# Patient Record
Sex: Male | Born: 1980 | Race: White | Hispanic: No | Marital: Single | Smoking: Current every day smoker
Health system: Southern US, Community
[De-identification: ages and names within clinical notes are randomized; demographics above are authoritative.]

---

## 2004-03-21 ENCOUNTER — Emergency Department (HOSPITAL_COMMUNITY): Admission: EM | Admit: 2004-03-21 | Discharge: 2004-03-21 | Payer: Self-pay | Admitting: Emergency Medicine

## 2009-04-09 ENCOUNTER — Ambulatory Visit: Payer: Self-pay | Admitting: Internal Medicine

## 2009-04-09 ENCOUNTER — Encounter: Payer: Self-pay | Admitting: Internal Medicine

## 2010-11-07 ENCOUNTER — Encounter: Payer: Self-pay | Admitting: Internal Medicine

## 2013-06-13 ENCOUNTER — Ambulatory Visit (INDEPENDENT_AMBULATORY_CARE_PROVIDER_SITE_OTHER): Payer: BC Managed Care – PPO | Admitting: Internal Medicine

## 2013-06-13 ENCOUNTER — Ambulatory Visit: Payer: BC Managed Care – PPO

## 2013-06-13 VITALS — BP 122/74 | HR 67 | Temp 98.4°F | Resp 18 | Ht 74.0 in | Wt 283.0 lb

## 2013-06-13 DIAGNOSIS — M25441 Effusion, right hand: Secondary | ICD-10-CM

## 2013-06-13 DIAGNOSIS — S60221A Contusion of right hand, initial encounter: Secondary | ICD-10-CM

## 2013-06-13 DIAGNOSIS — M25449 Effusion, unspecified hand: Secondary | ICD-10-CM

## 2013-06-13 DIAGNOSIS — M79609 Pain in unspecified limb: Secondary | ICD-10-CM

## 2013-06-13 DIAGNOSIS — M79641 Pain in right hand: Secondary | ICD-10-CM

## 2013-06-13 DIAGNOSIS — S63501A Unspecified sprain of right wrist, initial encounter: Secondary | ICD-10-CM
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
## 2015-01-15 IMAGING — CR DG HAND COMPLETE 3+V*R*
3 series · 3 of 3 positions shown · non-contrast
Comparison: None

CLINICAL DATA: Right hand pain and swelling post injury

EXAM:
RIGHT HAND - COMPLETE 3+ VIEW

[PA]
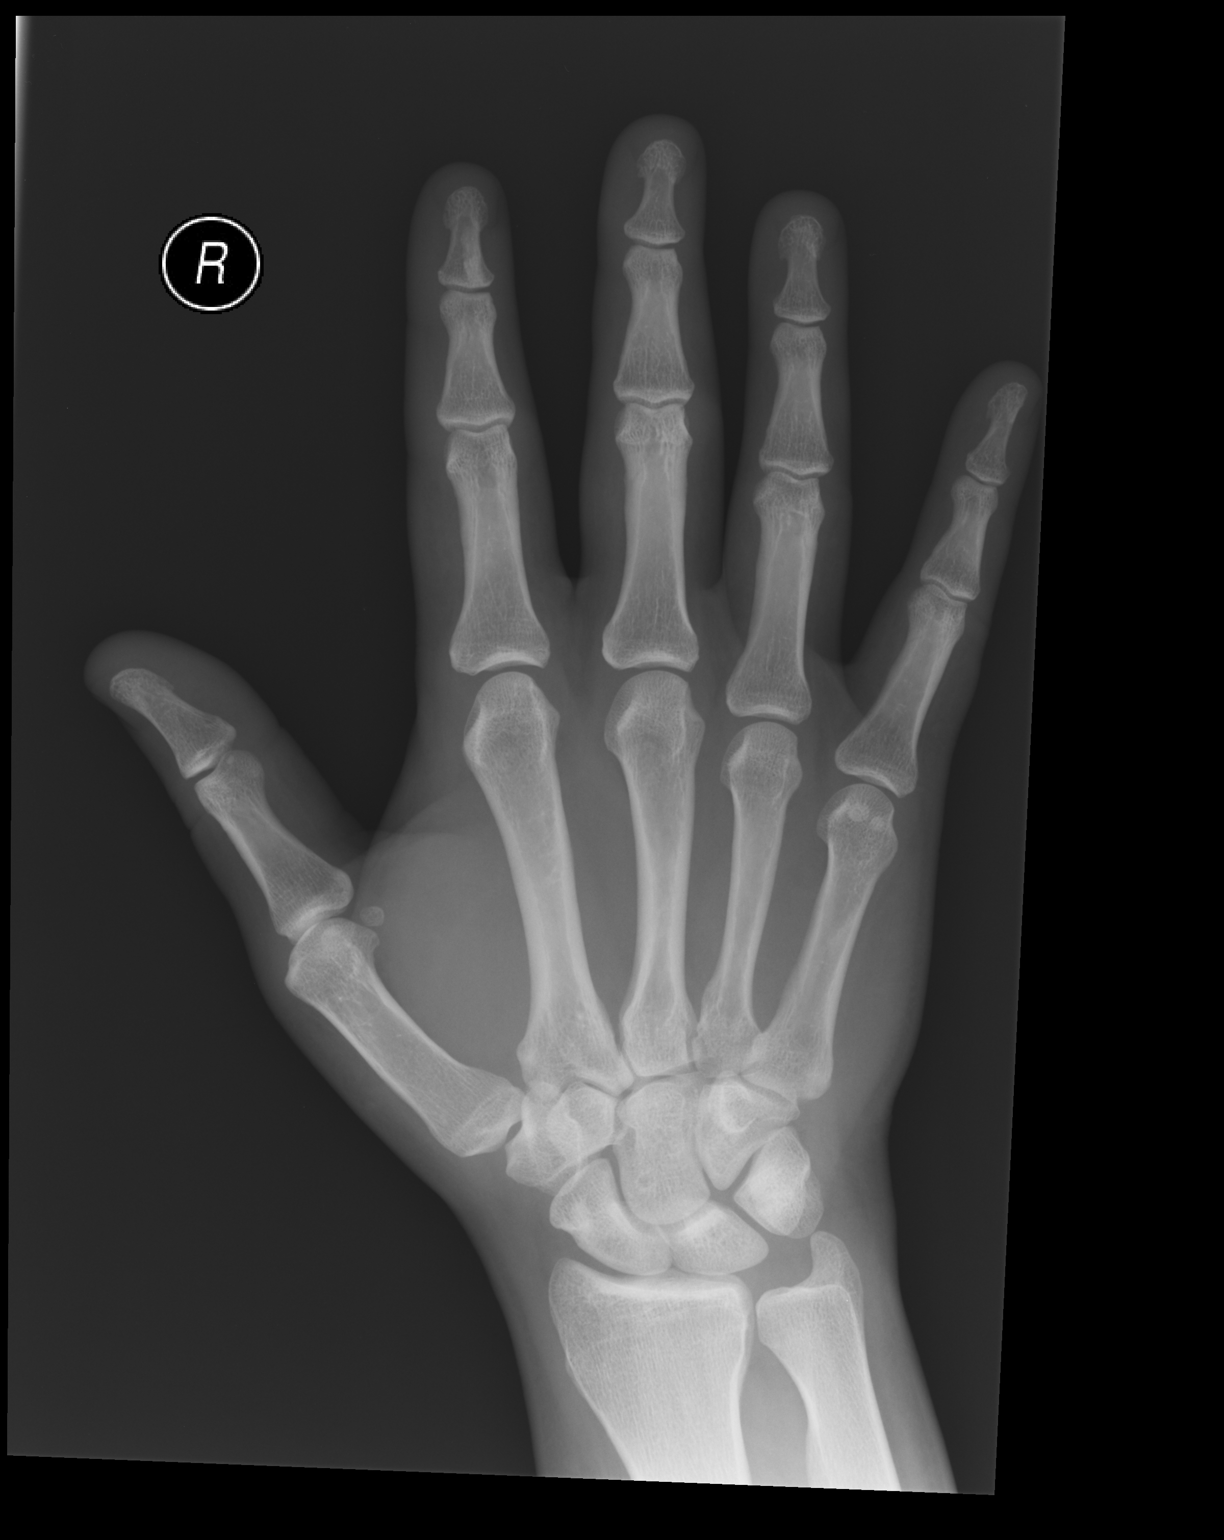

[lateral]
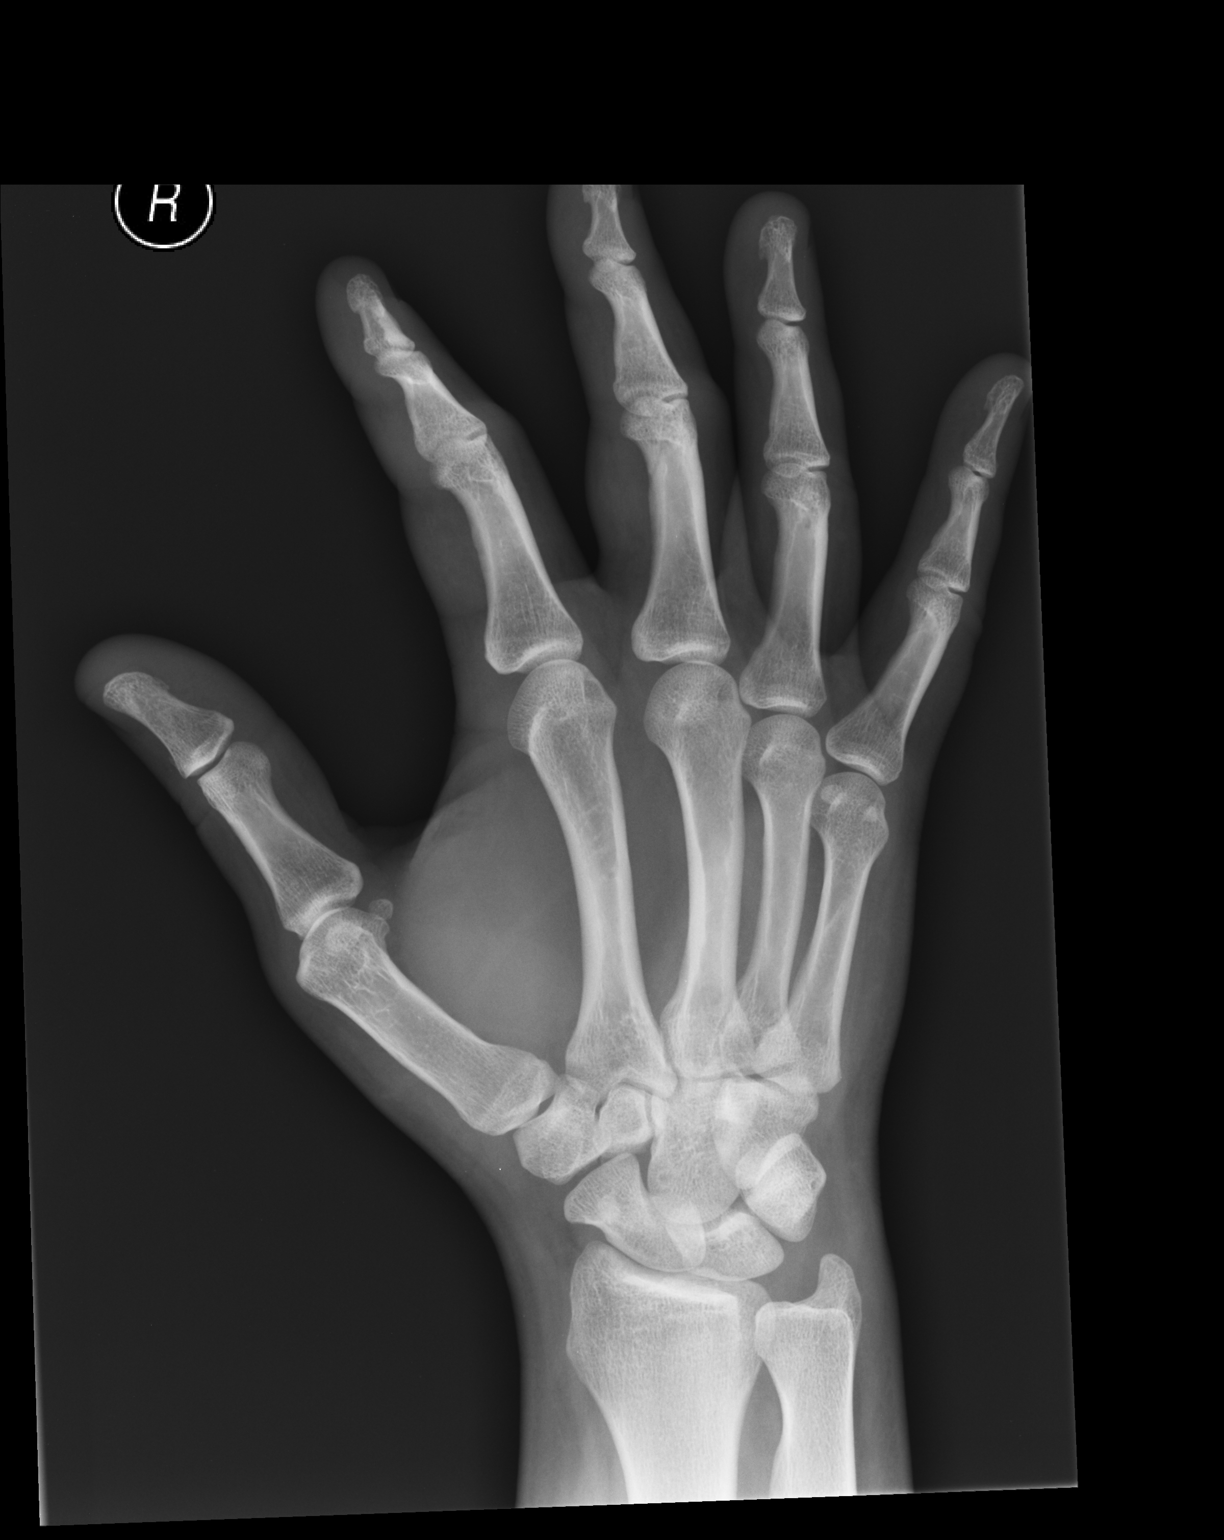

[pa obl]
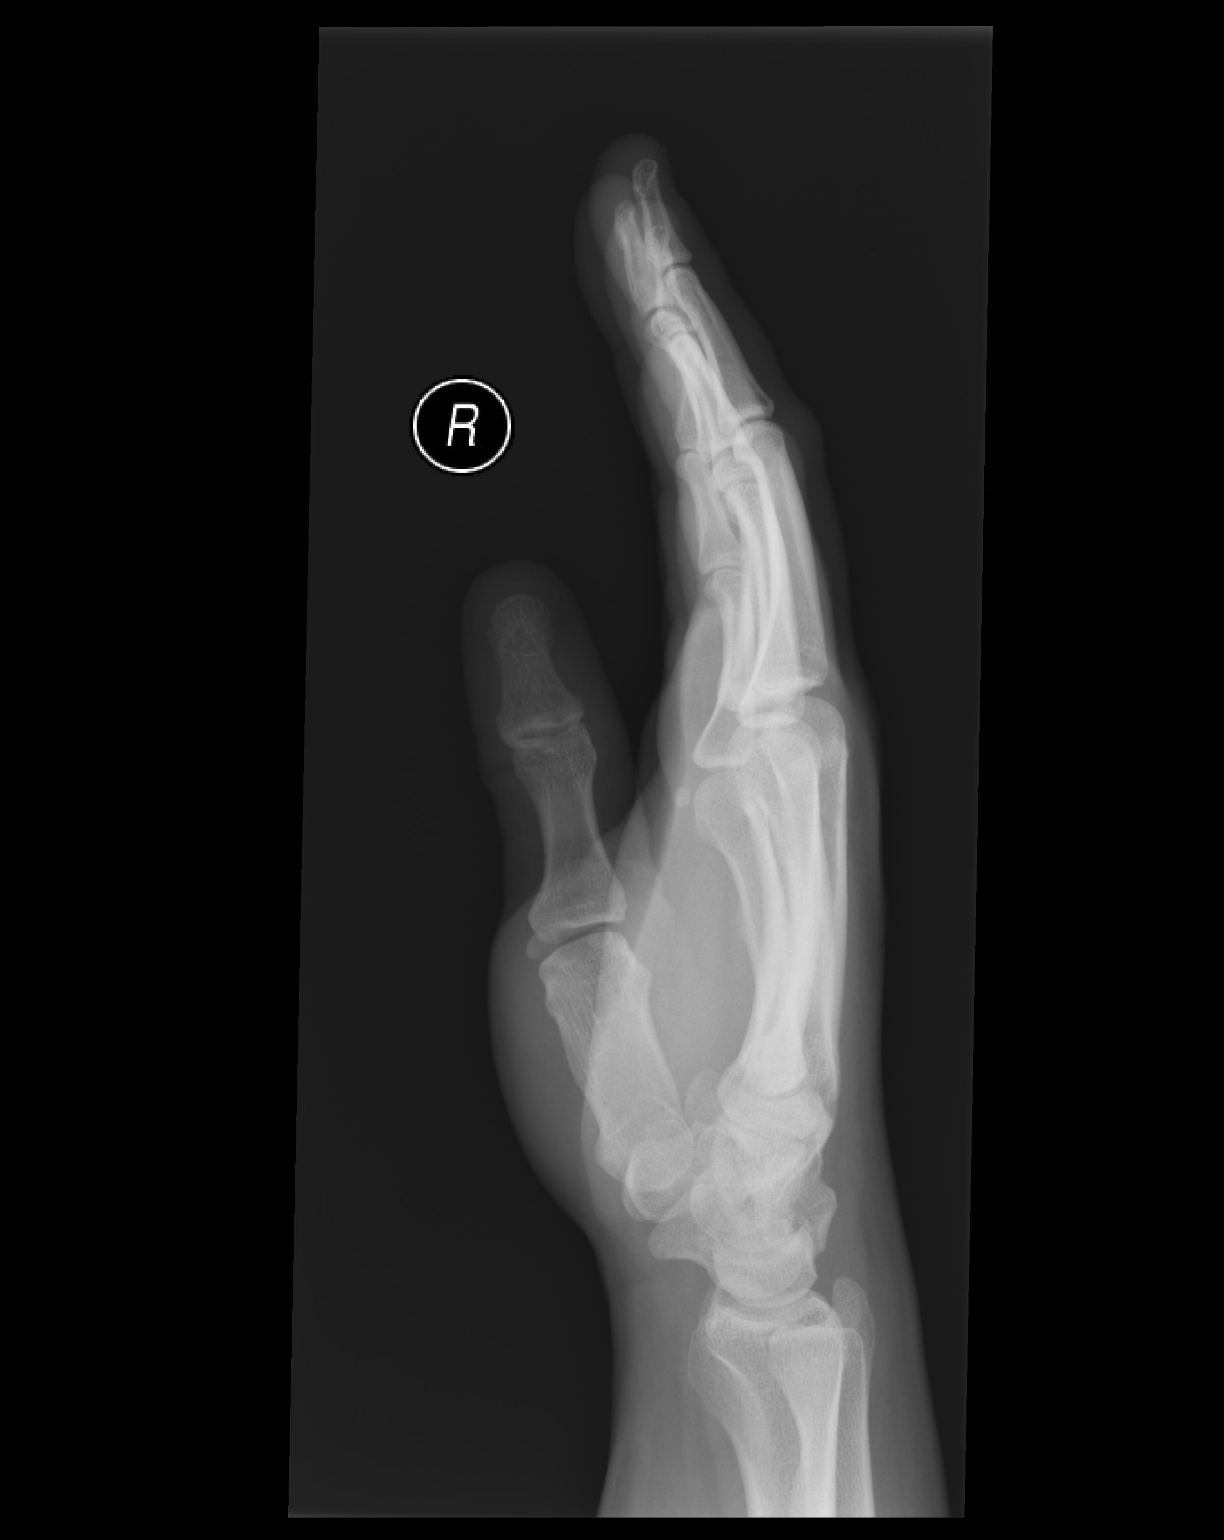

[3 of 3 positions shown; findings below may reference images not displayed]

FINDINGS: Joint spaces preserved.
Bone island the distal pole scaphoid.
No acute fracture, dislocation or bone destruction.
Fingers superimposed on lateral view limiting assessment.
IMPRESSION: No acute osseous abnormalities.

## 2015-06-07 ENCOUNTER — Ambulatory Visit (INDEPENDENT_AMBULATORY_CARE_PROVIDER_SITE_OTHER): Payer: 59 | Admitting: Family Medicine

## 2015-06-07 VITALS — BP 130/80 | HR 64 | Temp 98.2°F | Resp 16 | Ht 74.5 in | Wt 304.0 lb

## 2015-06-07 DIAGNOSIS — S29019A Strain of muscle and tendon of unspecified wall of thorax, initial encounter: Secondary | ICD-10-CM

## 2015-06-07 DIAGNOSIS — M62830 Muscle spasm of back: Secondary | ICD-10-CM | POA: Diagnosis not present

## 2015-06-07 DIAGNOSIS — S29012A Strain of muscle and tendon of back wall of thorax, initial encounter: Secondary | ICD-10-CM

## 2015-06-07 MED ORDER — TRAMADOL HCL 50 MG PO TABS: 50.0000 mg | ORAL_TABLET | Freq: Three times a day (TID) | ORAL | Status: AC | PRN

## 2015-06-07 MED ORDER — DICLOFENAC SODIUM 75 MG PO TBEC: 75.0000 mg | DELAYED_RELEASE_TABLET | Freq: Three times a day (TID) | ORAL | Status: AC

## 2015-06-07 MED ORDER — CYCLOBENZAPRINE HCL 10 MG PO TABS: 10.0000 mg | ORAL_TABLET | Freq: Three times a day (TID) | ORAL | Status: AC | PRN

## 2015-06-07 NOTE — Progress Notes (Signed)
Subjective:    Patient ID: Matthew Anthony, male    DOB: [birth year], 34 y.o.   MRN: [id number] This chart was scribed for Norberto Sorenson, MD by Jolene Provost, Medical Scribe. This patient was seen in Room 10 and the patient's care was started a 2:19 PM.  Chief Complaint  Patient presents with  . Spasms    ribs    HPI HPI Comments: Matthew Anthony is a 34 y.o. male who presents to [facility] complaining of mid back pain that radiates into the low back for the last two weeks. The pain was almost completely resolved as of two days ago, but then he re-aggravated the injury yesterday. He took tramadol after the initial injury and took it easy at work for a few days which helped. He works a [employer] all day. He took a flexeril two weeks ago, but did not notice any difference. He denies SOB, chest pain, or heart palpitations.    History reviewed. No pertinent past medical history. No Known Allergies No current outpatient prescriptions on file prior to visit.   No current facility-administered medications on file prior to visit.    Review of Systems  Constitutional: Negative for fever and chills.  Respiratory: Negative for shortness of breath.   Cardiovascular: Negative for chest pain and palpitations.  Musculoskeletal: Positive for back pain. Negative for neck pain and neck stiffness.  Skin: Negative for wound.       Objective:  BP 130/80 mmHg  Pulse 64  Temp(Src) 98.2 F (36.8 C) (Oral)  Resp 16  Ht 6' 2.5" (1.892 m)  Wt 304 lb (137.893 kg)  BMI 38.52 kg/m2  SpO2 98%  Physical Exam  Constitutional: He is oriented to person, place, and time. He appears well-developed and well-nourished. No distress.  HENT:  Head: Normocephalic and atraumatic.  Eyes: Pupils are equal, round, and reactive to light.  Neck: Neck supple.  Cardiovascular: Normal rate.   Pulmonary/Chest: Effort normal. No respiratory distress.  Musculoskeletal: Normal range of motion.  No point tenderness over  lower thoracic or lumbar. Mild erythema over right thoracic upper lumbar, no warmth or tenderness. Palpable muscle spasm in mid thoracic T-6-10.   Neurological: He is alert and oriented to person, place, and time. Coordination normal.  Skin: Skin is warm and dry. He is not diaphoretic.  Psychiatric: He has a normal mood and affect. His behavior is normal.  Nursing note and vitals reviewed.      Assessment & Plan:   If flexeril is ineffective will call in a small amount of SOMA. If he develops any rash call immediately, and will call in valtrex 1g TID.as there is a possibility this could be early dev shingles due to dermatomal pain w/o crossing midline but no sig rash currently 1. Strain of thoracic paraspinal muscles excluding T1 and T2 levels, initial encounter   2. Muscle spasm of back     Meds ordered this encounter  Medications  . cyclobenzaprine (FLEXERIL) 10 MG tablet    Sig: Take 1 tablet (10 mg total) by mouth 3 (three) times daily as needed for muscle spasms.    Dispense:  60 tablet    Refill:  0  . traMADol (ULTRAM) 50 MG tablet    Sig: Take 1 tablet (50 mg total) by mouth every 8 (eight) hours as needed for moderate pain.    Dispense:  30 tablet    Refill:  0  . diclofenac (VOLTAREN) 75 MG EC tablet  Sig: Take 1 tablet (75 mg total) by mouth 3 (three) times daily.    Dispense:  60 tablet    Refill:  0    Do not use with any other otc pain medication other than tylenol/acetaminophen - so no aleve, ibuprofen, motrin, advil, etc.    I personally performed the services described in this documentation, which was scribed in my presence. The recorded information has been reviewed and considered, and addended by me as needed.  Norberto Sorenson, MD MPH

## 2015-06-07 NOTE — Patient Instructions (Signed)
Thoracic Strain You have injured the muscles or tendons that attach to the upper part of your back behind your chest. This injury is called a thoracic strain, thoracic sprain, or mid-back strain.  CAUSES  The cause of thoracic strain varies. A less severe injury involves pulling a muscle or tendon without tearing it. A more severe injury involves tearing (rupturing) a muscle or tendon. With less severe injuries, there may be little loss of strength. Sometimes, there are breaks (fractures) in the bones to which the muscles are attached. These fractures are rare, unless there was a direct hit (trauma) or you have weak bones due to osteoporosis or age. Longstanding strains may be caused by overuse or improper form during certain movements. Obesity can also increase your risk for back injuries. Sudden strains may occur due to injury or not warming up properly before exercise. Often, there is no obvious cause for a thoracic strain. SYMPTOMS  The main symptom is pain, especially with movement, such as during exercise. DIAGNOSIS  Your caregiver can usually tell what is wrong by taking an X-ray and doing a physical exam. TREATMENT   Physical therapy may be helpful for recovery. Your caregiver can give you exercises to do or refer you to a physical therapist after your pain improves.  After your pain improves, strengthening and conditioning programs appropriate for your sport or occupation may be helpful.  Always warm up before physical activities or athletics. Stretching after physical activity may also help.  Certain over-the-counter medicines may also help. Ask your caregiver if there are medicines that would help you. If this is your first thoracic strain injury, proper care and proper healing time before starting activities should prevent long-term problems. Torn ligaments and tendons require as long to heal as broken bones. Average healing times may be only 1 week for a mild strain. For torn muscles  and tendons, healing time may be up to 6 weeks to 2 months. HOME CARE INSTRUCTIONS   Apply ice to the injured area. Ice massages may also be used as directed.  Put ice in a plastic bag.  Place a towel between your skin and the bag.  Leave the ice on for 15-20 minutes, 03-04 times a day, for the first 2 days.  Only take over-the-counter or prescription medicines for pain, discomfort, or fever as directed by your caregiver.  Keep your appointments for physical therapy if this was prescribed.  Use wraps and back braces as instructed. SEEK IMMEDIATE MEDICAL CARE IF:   You have an increase in bruising, swelling, or pain.  Your pain has not improved with medicines.  You develop new shortness of breath, chest pain, or fever.  Problems seem to be getting worse rather than better. MAKE SURE YOU:   Understand these instructions.  Will watch your condition.  Will get help right away if you are not doing well or get worse. Document Released: 11/28/2003 Document Revised: 11/30/2011 Document Reviewed: 10/24/2010 [employer] Patient Information 2015 [employer]. This information is not intended to replace advice given to you by your health care provider. Make sure you discuss any questions you have with your health care provider.
